# Patient Record
Sex: Male | Born: 2000 | Race: White | Hispanic: No | Marital: Single
Health system: Midwestern US, Academic
[De-identification: ages and names within clinical notes are randomized; demographics above are authoritative.]

---
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
## 2021-06-18 ENCOUNTER — Encounter: Admit: 2021-06-18 | Discharge: 2021-06-18 | Payer: BC Managed Care – PPO

## 2021-06-18 NOTE — Telephone Encounter
RN called to rescheduled Michael Meyer's appointment but his mom stated Michael Meyer was not able to be at the appointment due to getting married and moving to [place], [place]. Michael Meyer's mom reported Michael Meyer was doing great and experiencing no pain or issues in his left knee. Questions answered, instructed patient to call [phone] with any other questions or concerns.

## 2021-06-25 ENCOUNTER — Encounter: Admit: 2021-06-25 | Discharge: 2021-06-25 | Payer: BC Managed Care – PPO
